# Patient Record
Sex: Female | Born: 1970 | Hispanic: No | Marital: Married | Smoking: Never smoker
Health system: Southern US, Community
[De-identification: ages and names within clinical notes are randomized; demographics above are authoritative.]

## PROBLEM LIST (undated history)

## (undated) DIAGNOSIS — M26629 Arthralgia of temporomandibular joint, unspecified side: Secondary | ICD-10-CM

## (undated) DIAGNOSIS — J309 Allergic rhinitis, unspecified: Secondary | ICD-10-CM

## (undated) DIAGNOSIS — E559 Vitamin D deficiency, unspecified: Secondary | ICD-10-CM

## (undated) HISTORY — PX: OTHER SURGICAL HISTORY: SHX169

## (undated) HISTORY — PX: BREAST BIOPSY: SHX20

---

## 2005-10-08 ENCOUNTER — Emergency Department: Payer: Self-pay

## 2005-10-09 ENCOUNTER — Ambulatory Visit: Payer: Self-pay

## 2006-04-28 ENCOUNTER — Encounter: Payer: Self-pay | Admitting: Maternal & Fetal Medicine

## 2006-05-08 ENCOUNTER — Inpatient Hospital Stay: Payer: Self-pay

## 2012-12-02 ENCOUNTER — Ambulatory Visit: Payer: Self-pay | Admitting: Nurse Practitioner

## 2013-12-28 ENCOUNTER — Ambulatory Visit: Payer: Self-pay | Admitting: Nurse Practitioner

## 2014-08-18 ENCOUNTER — Other Ambulatory Visit: Payer: Self-pay | Admitting: Nurse Practitioner

## 2014-08-18 DIAGNOSIS — N63 Unspecified lump in unspecified breast: Secondary | ICD-10-CM

## 2014-08-19 ENCOUNTER — Other Ambulatory Visit: Payer: Self-pay | Admitting: Nurse Practitioner

## 2014-08-19 ENCOUNTER — Ambulatory Visit
Admission: RE | Admit: 2014-08-19 | Discharge: 2014-08-19 | Disposition: A | Discharge status: Home / Self Care | Payer: 59 | Source: Ambulatory Visit | Attending: Nurse Practitioner | Admitting: Nurse Practitioner

## 2014-08-19 DIAGNOSIS — N63 Unspecified lump in unspecified breast: Secondary | ICD-10-CM

## 2014-08-19 DIAGNOSIS — N6320 Unspecified lump in the left breast, unspecified quadrant: Secondary | ICD-10-CM

## 2014-08-23 LAB — SURGICAL PATHOLOGY

## 2015-03-03 ENCOUNTER — Other Ambulatory Visit: Payer: Self-pay | Admitting: Family Medicine

## 2015-03-03 DIAGNOSIS — Z1231 Encounter for screening mammogram for malignant neoplasm of breast: Secondary | ICD-10-CM

## 2015-03-13 ENCOUNTER — Ambulatory Visit
Admission: RE | Admit: 2015-03-13 | Discharge: 2015-03-13 | Disposition: A | Discharge status: Home / Self Care | Payer: 59 | Source: Ambulatory Visit | Attending: Family Medicine | Admitting: Family Medicine

## 2015-03-13 DIAGNOSIS — Z1231 Encounter for screening mammogram for malignant neoplasm of breast: Secondary | ICD-10-CM | POA: Diagnosis present

## 2015-11-22 ENCOUNTER — Ambulatory Visit
Admission: EM | Admit: 2015-11-22 | Discharge: 2015-11-22 | Disposition: A | Discharge status: Home / Self Care | Payer: 59 | Attending: Family Medicine | Admitting: Family Medicine

## 2015-11-22 ENCOUNTER — Ambulatory Visit (INDEPENDENT_AMBULATORY_CARE_PROVIDER_SITE_OTHER): Payer: 59

## 2015-11-22 DIAGNOSIS — S91134A Puncture wound without foreign body of right lesser toe(s) without damage to nail, initial encounter: Secondary | ICD-10-CM

## 2015-11-22 MED ORDER — ACETAMINOPHEN 500 MG PO TABS: 1000.0000 mg | ORAL_TABLET | Freq: Four times a day (QID) | ORAL | 0 refills | Status: AC | PRN

## 2015-11-22 MED ORDER — SULFAMETHOXAZOLE-TRIMETHOPRIM 800-160 MG PO TABS: 1.0000 | ORAL_TABLET | Freq: Two times a day (BID) | ORAL | 0 refills | Status: DC

## 2015-11-22 NOTE — Discharge Instructions (Signed)
Hot water/epsom soaks 15 minutes at least daily x 1 week right foot

## 2015-11-22 NOTE — ED Triage Notes (Addendum)
Patient went to the beach about 3 weeks ago and stepped on a fish and it feels hot, itching, and swollen. Baby Toe right foot.

## 2015-11-22 NOTE — ED Provider Notes (Signed)
CSN: [id number]     Arrival date & time 11/22/15  0806 History   First MD Initiated Contact with Patient 11/22/15 [phone]     Chief Complaint  Patient presents with  . Toe Pain    baby toe on right foot   (Consider location/radiation/quality/duration/timing/severity/associated sxs/prior Treatment) Married [place] female here for evaluation right baby toe pain x 3 weeks.  [place] [employer] had sharp pain/bleeding from wound cleaned with water and antiseptic and has taken tylenol but still having pain and some redness at affected area.  Patient needs work note Education officer, environmentalnail salon technician      History reviewed. No pertinent past medical history. Past Surgical History:  Procedure Laterality Date  . BREAST BIOPSY Left    negative   Family History  Problem Relation Age of Onset  . Breast cancer Mother    Social History  Substance Use Topics  . Smoking status: Never Smoker  . Smokeless tobacco: Never Used  . Alcohol use No   OB History    No data available     Review of Systems  Constitutional: Negative for chills and fever.  HENT: Negative for ear pain and sore throat.   Eyes: Negative for pain and visual disturbance.  Respiratory: Negative for cough and shortness of breath.   Cardiovascular: Negative for chest pain and palpitations.  Gastrointestinal: Negative for abdominal pain and vomiting.  Endocrine: Negative for polydipsia, polyphagia and polyuria.  Genitourinary: Negative for dysuria and hematuria.  Musculoskeletal: Positive for myalgias. Negative for arthralgias, back pain, gait problem and joint swelling.  Skin: Positive for color change, rash and wound. Negative for pallor.  Allergic/Immunologic: Negative for environmental allergies and food allergies.  Neurological: Negative for seizures and syncope.  Hematological: Negative for adenopathy. Does not bruise/bleed easily.  Psychiatric/Behavioral: Negative for sleep disturbance.    All other systems reviewed and are negative.   Allergies  Review of patient's allergies indicates no known allergies.  Home Medications   Prior to Admission medications   Medication Sig Start Date End Date Taking? Authorizing Provider  acetaminophen (TYLENOL) 500 MG tablet Take 2 tablets (1,000 mg total) by mouth every 6 (six) hours as needed for mild pain or moderate pain. 11/22/15 11/29/15  Barbaraann Barthelina A Betancourt, NP  sulfamethoxazole-trimethoprim (BACTRIM DS,SEPTRA DS) 800-160 MG tablet Take 1 tablet by mouth 2 (two) times daily. 11/22/15   Barbaraann Barthelina A Betancourt, NP   Meds Ordered and Administered this Visit  Medications - No data to display  BP 121/72 (BP Location: Left Arm)   Pulse 74   Temp 98.1 F (36.7 C) (Oral)   Resp 18   Ht 5\' 4"  (1.626 m)   Wt 122 lb (55.3 kg)   LMP 11/19/2015 (Exact Date) Comment: denies preg  SpO2 100%   BMI 20.94 kg/m  No data found.   Physical Exam  Constitutional: She is oriented to person, place, and time. Vital signs are normal. She appears well-developed and well-nourished.  Non-toxic appearance. She does not have a sickly appearance. She does not appear ill. No distress.  HENT:  Head: Normocephalic and atraumatic.  Right Ear: Hearing and external ear normal.  Left Ear: Hearing and external ear normal.  Nose: Nose normal.  Mouth/Throat: Uvula is midline, oropharynx is clear and moist and mucous membranes are normal. No oropharyngeal exudate.  Eyes: Conjunctivae, EOM and lids are normal. Pupils are equal, round, and reactive to light. Right eye exhibits no discharge. Left eye exhibits  no discharge. Right conjunctiva is not injected. Right conjunctiva has no hemorrhage. Left conjunctiva is not injected. Left conjunctiva has no hemorrhage. No scleral icterus. Right eye exhibits normal extraocular motion and no nystagmus. Left eye exhibits normal extraocular motion and no nystagmus.  Neck: Trachea normal, normal range of motion and phonation normal. Neck  supple. No tracheal deviation present. No thyromegaly present.  Cardiovascular: Normal rate, regular rhythm and intact distal pulses.   No murmur heard. Pulses:      Dorsalis pedis pulses are 2+ on the right side, and 2+ on the left side.       Posterior tibial pulses are 2+ on the right side, and 2+ on the left side.  Pulmonary/Chest: Effort normal and breath sounds normal. No stridor. No respiratory distress.  Abdominal: Soft. Normal appearance. There is no tenderness.  Musculoskeletal: Normal range of motion. She exhibits tenderness. She exhibits no edema or deformity.       Right shoulder: Normal.       Left shoulder: Normal.       Right elbow: Normal.      Left elbow: Normal.       Right hip: Normal.       Left hip: Normal.       Right knee: Normal.       Left knee: Normal.       Right ankle: Normal.       Left ankle: Normal.       Right hand: Normal.       Left hand: Normal.       Right foot: There is tenderness and swelling. There is normal range of motion, no bony tenderness, normal capillary refill, no crepitus, no deformity and no laceration.       Left foot: Normal.       Feet:  Neurological: She is alert and oriented to person, place, and time. She has normal strength. She is not disoriented. She displays no atrophy, no tremor and normal reflexes. No sensory deficit. She exhibits normal muscle tone. She displays no seizure activity. Coordination and gait normal. GCS eye subscore is 4. GCS verbal subscore is 5. GCS motor subscore is 6.  Skin: Skin is warm and dry. Capillary refill takes less than 2 seconds. Abrasion and rash noted. No bruising, no burn, no ecchymosis, no laceration, no lesion and no petechiae noted. Rash is macular. No cyanosis or erythema. No pallor. Nails show no clubbing.  Psychiatric: She has a normal mood and affect. Her behavior is normal. Judgment and thought content normal. She is not actively hallucinating. Cognition and memory are normal. She is  attentive.  Nursing note and vitals reviewed.   Urgent Care Course   Clinical Course    Procedures (including critical care time)  Labs Review Labs Reviewed - No data to display  Imaging Review Dg Toe 5th Right  Result Date: 11/22/2015 CLINICAL DATA:  Pt stepped on fish or something she [place] beach aug 10th 2017 and has a small healing cut that has some redness around iy on her right 5th pinky toe on medial tip. Most pain is dip joint area. Pt states doesn't hurt much but is concerned about the redness. EXAM: RIGHT FIFTH TOE COMPARISON:  None. FINDINGS: No fracture. No bone lesion. There is developmental fusion of the DIP joint. There is soft tissue swelling, but no radiopaque foreign body. No bone resorption is seen to suggest osteomyelitis. Joints normally spaced and aligned. IMPRESSION: 1. No fracture, bone  lesion or evidence of osteomyelitis. 2. No radiopaque foreign body. Electronically Signed   By: Amie Portland M.D.   On: 11/22/2015 08:52     0840 patient to xray ambulatory with xray tech  0908 Discussed xray results negative for foreign body, fracture or osteomyelitis patient given copy of radiology report; discussed hot foot soaks with or without epsom salts daily x 1 week.  Bactrim DS po BID x 7 days tylenol 1000mg  po QID prn pain.  Follow up with provider if worsening or no improvement with plan of care.  Patient verbalized understanding information/instructions agreed with plan of care and had no further questions at this time.   MDM   1. Puncture wound of fifth toe of right foot, initial encounter    Exitcare handout on skin infection given to patient.  RTC if worsening erythema, pain, purulent discharge, fever.  Wash towels, washcloths, sheets in hot water with bleach every couple of days until infection resolved. Work excuse x 24 hours given to patient.  Bactrim DS po BID x 7 days tylenol 1000mg  po QID prn pain.  If swelling/pain with dependent position elevate  and apply ice. Patient verbalized understanding, agreed with plan of care and had no further questions at this time.      Barbaraann Barthel, NP 11/22/15 [phone]

## 2016-06-20 ENCOUNTER — Other Ambulatory Visit: Payer: Self-pay | Admitting: Family Medicine

## 2016-06-21 ENCOUNTER — Other Ambulatory Visit: Payer: Self-pay | Admitting: Family Medicine

## 2016-06-21 DIAGNOSIS — Z1239 Encounter for other screening for malignant neoplasm of breast: Secondary | ICD-10-CM

## 2016-07-03 ENCOUNTER — Encounter: Payer: Self-pay | Admitting: Radiology

## 2016-07-03 ENCOUNTER — Ambulatory Visit
Admission: RE | Admit: 2016-07-03 | Discharge: 2016-07-03 | Disposition: A | Discharge status: Home / Self Care | Payer: Medicaid Other | Source: Ambulatory Visit | Attending: Family Medicine | Admitting: Family Medicine

## 2016-07-03 DIAGNOSIS — Z1231 Encounter for screening mammogram for malignant neoplasm of breast: Secondary | ICD-10-CM | POA: Diagnosis present

## 2016-07-03 DIAGNOSIS — Z1239 Encounter for other screening for malignant neoplasm of breast: Secondary | ICD-10-CM

## 2016-07-11 ENCOUNTER — Other Ambulatory Visit: Payer: Self-pay | Admitting: Family Medicine

## 2016-07-11 DIAGNOSIS — N6489 Other specified disorders of breast: Secondary | ICD-10-CM

## 2016-07-11 DIAGNOSIS — R928 Other abnormal and inconclusive findings on diagnostic imaging of breast: Secondary | ICD-10-CM

## 2016-07-18 ENCOUNTER — Ambulatory Visit
Admission: RE | Admit: 2016-07-18 | Discharge: 2016-07-18 | Disposition: A | Discharge status: Home / Self Care | Payer: Medicaid Other | Source: Ambulatory Visit | Attending: Family Medicine | Admitting: Family Medicine

## 2016-07-18 DIAGNOSIS — N6489 Other specified disorders of breast: Secondary | ICD-10-CM | POA: Diagnosis present

## 2016-07-18 DIAGNOSIS — N6002 Solitary cyst of left breast: Secondary | ICD-10-CM | POA: Insufficient documentation

## 2016-07-18 DIAGNOSIS — R928 Other abnormal and inconclusive findings on diagnostic imaging of breast: Secondary | ICD-10-CM

## 2017-01-29 ENCOUNTER — Other Ambulatory Visit: Payer: Self-pay | Admitting: Family Medicine

## 2017-01-29 DIAGNOSIS — R928 Other abnormal and inconclusive findings on diagnostic imaging of breast: Secondary | ICD-10-CM

## 2017-03-04 ENCOUNTER — Ambulatory Visit
Admission: RE | Admit: 2017-03-04 | Discharge: 2017-03-04 | Disposition: A | Discharge status: Home / Self Care | Payer: Medicaid Other | Source: Ambulatory Visit | Attending: Family Medicine | Admitting: Family Medicine

## 2017-03-04 ENCOUNTER — Other Ambulatory Visit: Payer: Self-pay | Admitting: Family Medicine

## 2017-03-04 DIAGNOSIS — N6310 Unspecified lump in the right breast, unspecified quadrant: Secondary | ICD-10-CM

## 2017-03-04 DIAGNOSIS — R928 Other abnormal and inconclusive findings on diagnostic imaging of breast: Secondary | ICD-10-CM | POA: Diagnosis present

## 2017-03-04 DIAGNOSIS — N6322 Unspecified lump in the left breast, upper inner quadrant: Secondary | ICD-10-CM | POA: Insufficient documentation

## 2017-06-19 ENCOUNTER — Other Ambulatory Visit: Payer: Self-pay | Admitting: Family Medicine

## 2017-06-19 DIAGNOSIS — R928 Other abnormal and inconclusive findings on diagnostic imaging of breast: Secondary | ICD-10-CM

## 2017-07-11 ENCOUNTER — Ambulatory Visit
Admission: EM | Admit: 2017-07-11 | Discharge: 2017-07-11 | Disposition: A | Discharge status: Home / Self Care | Payer: BLUE CROSS/BLUE SHIELD | Attending: Family Medicine | Admitting: Family Medicine

## 2017-07-11 ENCOUNTER — Encounter: Payer: Self-pay | Admitting: *Deleted

## 2017-07-11 DIAGNOSIS — R350 Frequency of micturition: Secondary | ICD-10-CM | POA: Diagnosis not present

## 2017-07-11 DIAGNOSIS — R319 Hematuria, unspecified: Secondary | ICD-10-CM

## 2017-07-11 DIAGNOSIS — R300 Dysuria: Secondary | ICD-10-CM | POA: Diagnosis not present

## 2017-07-11 DIAGNOSIS — N3001 Acute cystitis with hematuria: Secondary | ICD-10-CM | POA: Diagnosis not present

## 2017-07-11 LAB — URINALYSIS, COMPLETE (UACMP) WITH MICROSCOPIC
Bacteria, UA: NONE SEEN
Bilirubin Urine: NEGATIVE
GLUCOSE, UA: NEGATIVE mg/dL
KETONES UR: NEGATIVE mg/dL
Nitrite: NEGATIVE
Protein, ur: 100 mg/dL — AB
Specific Gravity, Urine: 1.02 (ref 1.005–1.030)
pH: 7 (ref 5.0–8.0)

## 2017-07-11 MED ORDER — CEPHALEXIN 500 MG PO CAPS: 500.0000 mg | ORAL_CAPSULE | Freq: Two times a day (BID) | ORAL | 0 refills | Status: DC

## 2017-07-11 NOTE — ED Triage Notes (Signed)
C/o frequent urination with some blood in urine. Symptoms started this afternoon.

## 2017-07-11 NOTE — ED Provider Notes (Signed)
[facility]    CSN: [id number] Arrival date & time: 07/11/17  1656     History   Chief Complaint Chief Complaint  Patient presents with  . Urinary Tract Infection    HPI Heather Minh Aldrete is a 47 y.o. female.   The history is provided by the patient.  Urinary Tract Infection  Associated symptoms: no abdominal pain, no fever, no flank pain, no genital lesions, no nausea, no vaginal discharge and no vomiting   Dysuria  Pain quality:  Burning Pain severity:  Mild Onset quality:  Sudden Duration:  4 hours Timing:  Constant Progression:  Unchanged Chronicity:  New Recent urinary tract infections: no   Relieved by:  None tried Ineffective treatments:  None tried Urinary symptoms: frequent urination and hematuria   Associated symptoms: no abdominal pain, no fever, no flank pain, no genital lesions, no nausea, no vaginal discharge and no vomiting   Risk factors: no hx of pyelonephritis, no hx of urolithiasis, no kidney transplant, not pregnant, no recurrent urinary tract infections, no renal cysts, no renal disease, no single kidney and no urinary catheter     History reviewed. No pertinent past medical history.  There are no active problems to display for this patient.   Past Surgical History:  Procedure Laterality Date  . BREAST BIOPSY Left    negative    OB History   None      Home Medications    Prior to Admission medications   Medication Sig Start Date End Date Taking? Authorizing Provider  cephALEXin (KEFLEX) 500 MG capsule Take 1 capsule (500 mg total) by mouth 2 (two) times daily. 07/11/17   Payton Mccallumonty, Lataysha Vohra, MD  sulfamethoxazole-trimethoprim (BACTRIM DS,SEPTRA DS) 800-160 MG tablet Take 1 tablet by mouth 2 (two) times daily. 11/22/15   Betancourt, Jarold Songina A, NP    Family History Family History  Problem Relation Age of Onset  . Breast cancer Mother 236    Social History Social History   Tobacco Use  . Smoking status: Never Smoker  . Smokeless  tobacco: Never Used  Substance Use Topics  . Alcohol use: No  . Drug use: No     Allergies   Patient has no known allergies.   Review of Systems Review of Systems  Constitutional: Negative for fever.  Gastrointestinal: Negative for abdominal pain, nausea and vomiting.  Genitourinary: Positive for dysuria. Negative for flank pain and vaginal discharge.     Physical Exam Triage Vital Signs ED Triage Vitals  Enc Vitals Group     BP 07/11/17 1708 131/74     Pulse Rate 07/11/17 1708 70     Resp 07/11/17 1708 16     Temp 07/11/17 1708 98.2 F (36.8 C)     Temp Source 07/11/17 1708 Oral     SpO2 07/11/17 1708 100 %     Weight 07/11/17 1706 120 lb (54.4 kg)     Height 07/11/17 1706 5\' 4"  (1.626 m)     Head Circumference --      Peak Flow --      Pain Score 07/11/17 1705 0     Pain Loc --      Pain Edu? --      Excl. in [place]? --    No data found.  Updated Vital Signs BP 131/74 (BP Location: Left Arm)   Pulse 70   Temp 98.2 F (36.8 C) (Oral)   Resp 16   Ht 5\' 4"  (1.626 m)   Wt 120  lb (54.4 kg)   LMP 06/26/2017   SpO2 100%   BMI 20.60 kg/m   Visual Acuity Right Eye Distance:   Left Eye Distance:   Bilateral Distance:    Right Eye Near:   Left Eye Near:    Bilateral Near:     Physical Exam  Constitutional: She appears well-developed and well-nourished. No distress.  Abdominal: Soft. Bowel sounds are normal. She exhibits no distension and no mass. There is no tenderness. There is no rebound and no guarding.  Skin: She is not diaphoretic.  Nursing note and vitals reviewed.    UC Treatments / Results  Labs (all labs ordered are listed, but only abnormal results are displayed) Labs Reviewed  URINALYSIS, COMPLETE (UACMP) WITH MICROSCOPIC - Abnormal; Notable for the following components:      Result Value   Color, Urine PINK (*)    APPearance HAZY (*)    Hgb urine dipstick LARGE (*)    Protein, ur 100 (*)    Leukocytes, UA MODERATE (*)    Squamous  Epithelial / LPF 0-5 (*)    All other components within normal limits  URINE CULTURE    EKG None Radiology No results found.  Procedures Procedures (including critical care time)  Medications Ordered in UC Medications - No data to display   Initial Impression / Assessment and Plan / UC Course  I have reviewed the triage vital signs and the nursing notes.  Pertinent labs & imaging results that were available during my care of the patient were reviewed by me and considered in my medical decision making (see chart for details).       Final Clinical Impressions(s) / UC Diagnoses   Final diagnoses:  Acute cystitis with hematuria    ED Discharge Orders        Ordered    cephALEXin (KEFLEX) 500 MG capsule  2 times daily     07/11/17 1751     1. Lab results and diagnosis reviewed with patient 2. rx as per orders above; reviewed possible side effects, interactions, risks and benefits  3. Recommend supportive treatment with increased water intake  4. Follow-up prn if symptoms worsen or don't improve  Controlled Substance Prescriptions [place] Controlled Substance Registry consulted? Not Applicable   Payton Mccallum, MD 07/11/17 1755

## 2017-07-14 LAB — URINE CULTURE: SPECIAL REQUESTS: NORMAL

## 2017-07-16 ENCOUNTER — Telehealth (HOSPITAL_COMMUNITY): Payer: Self-pay

## 2017-07-16 NOTE — Telephone Encounter (Signed)
Pt urine culture positive for E.Coli, this was treated with Keflex at urgent care visit. Patient called and educated to finish all of antibiotics and follow up for persistent symptoms.

## 2017-10-10 ENCOUNTER — Other Ambulatory Visit: Payer: BLUE CROSS/BLUE SHIELD

## 2017-10-20 ENCOUNTER — Ambulatory Visit
Admission: RE | Admit: 2017-10-20 | Discharge: 2017-10-20 | Disposition: A | Discharge status: Home / Self Care | Payer: BLUE CROSS/BLUE SHIELD | Source: Ambulatory Visit | Attending: Family Medicine | Admitting: Family Medicine

## 2017-10-20 DIAGNOSIS — R928 Other abnormal and inconclusive findings on diagnostic imaging of breast: Secondary | ICD-10-CM

## 2018-10-01 ENCOUNTER — Other Ambulatory Visit: Payer: Self-pay

## 2018-10-01 ENCOUNTER — Encounter: Payer: Self-pay | Admitting: Emergency Medicine

## 2018-10-01 ENCOUNTER — Ambulatory Visit
Admission: EM | Admit: 2018-10-01 | Discharge: 2018-10-01 | Disposition: A | Discharge status: Home / Self Care | Payer: BC Managed Care – PPO | Attending: Emergency Medicine | Admitting: Emergency Medicine

## 2018-10-01 DIAGNOSIS — R300 Dysuria: Secondary | ICD-10-CM | POA: Diagnosis not present

## 2018-10-01 DIAGNOSIS — R319 Hematuria, unspecified: Secondary | ICD-10-CM

## 2018-10-01 DIAGNOSIS — N390 Urinary tract infection, site not specified: Secondary | ICD-10-CM

## 2018-10-01 LAB — URINALYSIS, COMPLETE (UACMP) WITH MICROSCOPIC
RBC / HPF: >50 RBC/hpf (ref 0–5)
WBC, UA: >50 WBC/hpf (ref 0–5)

## 2018-10-01 MED ORDER — CEPHALEXIN 500 MG PO CAPS: 500.0000 mg | ORAL_CAPSULE | Freq: Two times a day (BID) | ORAL | 0 refills | Status: AC

## 2018-10-01 NOTE — ED Triage Notes (Signed)
Patient states she has had frequent urination for the past several days and states she feels like it could be a UTI

## 2018-10-01 NOTE — Discharge Instructions (Addendum)
Take medication as prescribed. Rest. Drink plenty of fluids.  ° °Follow up with your primary care physician this week as needed. Return to Urgent care for new or worsening concerns.  ° °

## 2018-10-01 NOTE — ED Provider Notes (Addendum)
[facility] ____________________________________________  Time seen: Approximately 4:22 PM  I have reviewed the triage vital signs and the nursing notes.   HISTORY  Chief Complaint Dysuria   HPI Heather Harrison is a 48 y.o. female presenting for evaluation of urinary frequency, urinary urgency and some burning with urination present for the last several days.  Patient states this feels like her previous UTI.  Has not taken any over-the-counter medication for the same complaints.  Denies vaginal complaints.  Denies pain at this time.  Continues eat and drink well.  Denies chest pain, shortness of breath, abdominal pain, back pain, recent antibiotic use or fevers.   History reviewed. No pertinent past medical history.  There are no active problems to display for this patient.   Past Surgical History:  Procedure Laterality Date  . BREAST BIOPSY Left    negative  . c-section       No current facility-administered medications for this encounter.   Current Outpatient Medications:  .  cholecalciferol (VITAMIN D3) 25 MCG (1000 UT) tablet, Take 1,000 Units by mouth daily., Disp: , Rfl:  .  cephALEXin (KEFLEX) 500 MG capsule, Take 1 capsule (500 mg total) by mouth 2 (two) times daily for 7 days., Disp: 14 capsule, Rfl: 0  Allergies Patient has no known allergies.  Family History  Problem Relation Age of Onset  . Breast cancer Mother 70    Social History Social History   Tobacco Use  . Smoking status: Never Smoker  . Smokeless tobacco: Never Used  Substance Use Topics  . Alcohol use: No  . Drug use: No    Review of Systems Constitutional: No fever ENT: No sore throat. Cardiovascular: Denies chest pain. Respiratory: Denies shortness of breath. Gastrointestinal: No abdominal pain.   Genitourinary: Positive for dysuria. Musculoskeletal: Negative for back pain. Skin: Negative for rash.   ____________________________________________   PHYSICAL EXAM:   VITAL SIGNS: ED Triage Vitals  Enc Vitals Group     BP 10/01/18 1532 122/83     Pulse Rate 10/01/18 1532 86     Resp 10/01/18 1532 18     Temp 10/01/18 1532 98.8 F (37.1 C)     Temp Source 10/01/18 1532 Oral     SpO2 10/01/18 1532 100 %     Weight 10/01/18 1535 128 lb (58.1 kg)     Height 10/01/18 1535 5\' 4"  (1.626 m)     Head Circumference --      Peak Flow --      Pain Score 10/01/18 1535 0     Pain Loc --      Pain Edu? --      Excl. in [place]? --     Constitutional: Alert and oriented. Well appearing and in no acute distress. Eyes: Conjunctivae are normal. ENT      Head: Normocephalic and atraumatic. Cardiovascular: Normal rate, regular rhythm. Grossly normal heart sounds.  Good peripheral circulation. Respiratory: Normal respiratory effort without tachypnea nor retractions. Breath sounds are clear and equal bilaterally. No wheezes, rales, rhonchi. Gastrointestinal: Soft and nontender. No distention. No CVA tenderness. Musculoskeletal: No midline cervical, thoracic or lumbar tenderness to palpation.  Neurologic:  Normal speech and language.Speech is normal. No gait instability.  Skin:  Skin is warm, dry. Psychiatric: Mood and affect are normal. Speech and behavior are normal. Patient exhibits appropriate insight and judgment   ___________________________________________   LABS (all labs ordered are listed, but only abnormal results are displayed)  Labs Reviewed  URINALYSIS, COMPLETE (  UACMP) WITH MICROSCOPIC - Abnormal; Notable for the following components:      Result Value   Color, Urine RED (*)    APPearance CLOUDY (*)    Glucose, UA   (*)    Value: TEST NOT REPORTED DUE TO COLOR INTERFERENCE OF URINE PIGMENT   Hgb urine dipstick   (*)    Value: TEST NOT REPORTED DUE TO COLOR INTERFERENCE OF URINE PIGMENT   Bilirubin Urine   (*)    Value: TEST NOT REPORTED DUE TO COLOR INTERFERENCE OF URINE PIGMENT   Ketones, ur   (*)    Value: TEST NOT REPORTED DUE TO COLOR  INTERFERENCE OF URINE PIGMENT   Protein, ur   (*)    Value: TEST NOT REPORTED DUE TO COLOR INTERFERENCE OF URINE PIGMENT   Nitrite   (*)    Value: TEST NOT REPORTED DUE TO COLOR INTERFERENCE OF URINE PIGMENT   Leukocytes,Ua   (*)    Value: TEST NOT REPORTED DUE TO COLOR INTERFERENCE OF URINE PIGMENT   Bacteria, UA RARE (*)    All other components within normal limits  URINE CULTURE     PROCEDURES Procedures    INITIAL IMPRESSION / ASSESSMENT AND PLAN / ED COURSE  Pertinent labs & imaging results that were available during my care of the patient were reviewed by me and considered in my medical decision making (see chart for details).  Well-appearing patient.  No acute distress.  Urinalysis reviewed, suspect UTI.  We will culture urine.  Will empirically start oral Keflex.  Encourage rest, fluids, supportive care.Discussed indication, risks and benefits of medications with patient.  Discussed follow up with Primary care physician this week. Discussed follow up and return parameters including no resolution or any worsening concerns. Patient verbalized understanding and agreed to plan.   ____________________________________________   FINAL CLINICAL IMPRESSION(S) / ED DIAGNOSES  Final diagnoses:  Dysuria  Urinary tract infection with hematuria, site unspecified     ED Discharge Orders         Ordered    cephALEXin (KEFLEX) 500 MG capsule  2 times daily     10/01/18 1623           Note: This dictation was prepared with Dragon dictation along with smaller phrase technology. Any transcriptional errors that result from this process are unintentional.         Renford DillsMiller, Chandler Swiderski, NP 10/01/18 1632    Renford DillsMiller, Deni Berti, NP 10/01/18 [phone]

## 2018-10-03 LAB — URINE CULTURE: Culture: NO GROWTH

## 2019-06-07 ENCOUNTER — Ambulatory Visit
Admission: EM | Admit: 2019-06-07 | Discharge: 2019-06-07 | Disposition: A | Discharge status: Home / Self Care | Payer: BC Managed Care – PPO | Attending: Family Medicine | Admitting: Family Medicine

## 2019-06-07 ENCOUNTER — Other Ambulatory Visit: Payer: Self-pay

## 2019-06-07 ENCOUNTER — Encounter: Payer: Self-pay | Admitting: Emergency Medicine

## 2019-06-07 DIAGNOSIS — M62838 Other muscle spasm: Secondary | ICD-10-CM

## 2019-06-07 NOTE — Discharge Instructions (Signed)
Use heat or ice over the area for help with the spasm.  Consider using Biofreeze over the area for comfort.  May use Tylenol for pain being careful not to take more than 4000 mg daily.

## 2019-06-07 NOTE — ED Provider Notes (Signed)
[facility]    CSN: [id number] Arrival date & time: 06/07/19  1031      History   Chief Complaint Chief Complaint  Patient presents with  . Neck Pain    HPI Heather Harrison is a 49 y.o. female.   HPI  -year-old female presents with pain on the left side of her neck from the occipital ridge inferiorly into the left trapezial area.  She has no known injury.  She received her Covid vaccine shot on Saturday 2 days prior to this visit.  She states that the evening of receiving the injection , she started noticing her neck pain.  Is that worsened since that period of time.  She has no radicular symptoms.  Does have an area that seem to be maximal in tenderness and a lump type feeling in this area.  She has no fever or chills.      History reviewed. No pertinent past medical history.  There are no problems to display for this patient.   Past Surgical History:  Procedure Laterality Date  . BREAST BIOPSY Left    negative  . c-section      OB History   No obstetric history on file.      Home Medications    Prior to Admission medications   Medication Sig Start Date End Date Taking? Authorizing Provider  cholecalciferol (VITAMIN D3) 25 MCG (1000 UT) tablet Take 1,000 Units by mouth daily.   Yes [provider]  omeprazole (PRILOSEC OTC) 20 MG tablet Take by mouth.    [provider]    Family History Family History  Problem Relation Age of Onset  . Breast cancer Mother 61    Social History Social History   Tobacco Use  . Smoking status: Never Smoker  . Smokeless tobacco: Never Used  Substance Use Topics  . Alcohol use: No  . Drug use: No     Allergies   Patient has no known allergies.   Review of Systems Review of Systems  Constitutional: Positive for activity change. Negative for appetite change, chills, diaphoresis, fatigue and fever.  Musculoskeletal: Positive for myalgias and neck pain.  All other systems reviewed and  are negative.    Physical Exam Triage Vital Signs ED Triage Vitals  Enc Vitals Group     BP 06/07/19 1102 140/87     Pulse Rate 06/07/19 1102 76     Resp 06/07/19 1102 18     Temp 06/07/19 1102 98.3 F (36.8 C)     Temp Source 06/07/19 1102 Oral     SpO2 06/07/19 1102 100 %     Weight 06/07/19 1058 126 lb (57.2 kg)     Height 06/07/19 1058 5\' 4"  (1.626 m)     Head Circumference --      Peak Flow --      Pain Score 06/07/19 1058 6     Pain Loc --      Pain Edu? --      Excl. in GC? --    No data found.  Updated Vital Signs BP 140/87 (BP Location: Left Arm)   Pulse 76   Temp 98.3 F (36.8 C) (Oral)   Resp 18   Ht 5\' 4"  (1.626 m)   Wt 126 lb (57.2 kg)   LMP 05/14/2019 (Approximate)   SpO2 100%   BMI 21.63 kg/m   Visual Acuity Right Eye Distance:   Left Eye Distance:   Bilateral Distance:    Right  Eye Near:   Left Eye Near:    Bilateral Near:     Physical Exam Vitals and nursing note reviewed.  Constitutional:      General: She is not in acute distress.    Appearance: Normal appearance. She is normal weight. She is not ill-appearing or toxic-appearing.  HENT:     Head: Normocephalic and atraumatic.  Eyes:     Conjunctiva/sclera: Conjunctivae normal.  Neck:     Comments: Examination of the cervical spine shows tenderness to palpation along the paraspinous muscles on the left.  Is 1 small area tenderness with with palpable spasm inferior to the occipital ridge.  Lateral flexion and extension to the left causes her to have discomfort in the area.  Also flexion at the extreme also causes her to have the pain.  Upper extremity strength and sensation are intact to clinical resistance. Musculoskeletal:        General: Tenderness present. Normal range of motion.     Cervical back: Normal range of motion. Tenderness present. No rigidity.  Skin:    General: Skin is warm and dry.  Neurological:     General: No focal deficit present.     Mental Status: She is alert  and oriented to person, place, and time.  Psychiatric:        Mood and Affect: Mood normal.        Behavior: Behavior normal.        Thought Content: Thought content normal.        Judgment: Judgment normal.      UC Treatments / Results  Labs (all labs ordered are listed, but only abnormal results are displayed) Labs Reviewed - No data to display  EKG   Radiology No results found.  Procedures Procedures (including critical care time)  Medications Ordered in UC Medications - No data to display  Initial Impression / Assessment and Plan / UC Course  I have reviewed the triage vital signs and the nursing notes.  Pertinent labs & imaging results that were available during my care of the patient were reviewed by me and considered in my medical decision making (see chart for details).   49 year old female presents with sided neck pain of 3 days duration.  Is no known injury.  She states that the pain is in the paraspinous muscles just inferior to the occipital ridge.  She has no radicular symptoms.  She states that she does have area that is point tender.  Is concerned that this may be a result of her Covid in injection for vaccine.  Examination showed mild limitation of motion of the cervical spine particularly with flexion ,lateral flexion and extension.  Also she is tender in the paraspinous muscles from the occipital ridge into the trapezius.  This reproduces her symptoms.  Since she has recently undergone injection of Covid vaccine in the left deltoid. I have recommended that she take Tylenol for pain and consider using Biofreeze or similar topical treatment for pain control.  She should avoid symptoms much as possible.  She may also use heat or ice as necessary.  If she is not improving she should follow-up with her primary care physician.   Final Clinical Impressions(s) / UC Diagnoses   Final diagnoses:  Muscle spasms of neck     Discharge Instructions     Use heat or  ice over the area for help with the spasm.  Consider using Biofreeze over the area for comfort.  May use Tylenol for pain  being careful not to take more than 4000 mg daily.   ED Prescriptions    None     PDMP not reviewed this encounter.   Lorin Picket, PA-C 06/07/19 1713

## 2019-06-07 NOTE — ED Triage Notes (Signed)
Pt c/o neck pain on her left side. Started about 3 days ago. No known injury. She is concerned it is related to the covid vaccine because she received it the same day it started hurting.

## 2019-09-29 ENCOUNTER — Other Ambulatory Visit: Payer: Self-pay | Admitting: Gerontology

## 2019-09-29 DIAGNOSIS — R599 Enlarged lymph nodes, unspecified: Secondary | ICD-10-CM

## 2019-12-15 ENCOUNTER — Ambulatory Visit
Admission: EM | Admit: 2019-12-15 | Discharge: 2019-12-15 | Disposition: A | Discharge status: Home / Self Care | Payer: BC Managed Care – PPO | Attending: Emergency Medicine | Admitting: Emergency Medicine

## 2019-12-15 ENCOUNTER — Other Ambulatory Visit: Payer: Self-pay

## 2019-12-15 ENCOUNTER — Encounter: Payer: Self-pay | Admitting: Emergency Medicine

## 2019-12-15 DIAGNOSIS — R5383 Other fatigue: Secondary | ICD-10-CM | POA: Insufficient documentation

## 2019-12-15 DIAGNOSIS — S00512A Abrasion of oral cavity, initial encounter: Secondary | ICD-10-CM | POA: Diagnosis present

## 2019-12-15 LAB — GLUCOSE, CAPILLARY: Glucose-Capillary: 75 mg/dL (ref 70–99)

## 2019-12-15 MED ORDER — CHLORHEXIDINE GLUCONATE 0.12 % MT SOLN: OROMUCOSAL | 0 refills | Status: AC

## 2019-12-15 NOTE — Discharge Instructions (Addendum)
Salt water rinses 3-4 times a day to help keep your mouth clean.  May use the Peridex or Listerine as well, which have antibiotic properties.  Continue the triamcinolone paste.  This will take 4 to 7 days for it to fully heal.  I do not think that you need oral antibiotics at this time.  Your sugar is normal.  This is not low blood sugar or diabetes.  Get an extra hour of sleep at night and see if that does not help with your fatigue.  Follow-up with your doctor in several days if it does not resolve. There are many different causes of being tired, however I do not think that you have an emergency today.

## 2019-12-15 NOTE — ED Provider Notes (Signed)
HPI  SUBJECTIVE:  Heather Harrison is a 49 y.o. female who presents with 2 issues.  First, she reports an abrasion to the roof of her mouth after eating some hard bread the other day.  She states that she scraped her mouth.  Denies pain with swallowing.  She saw a dentist yesterday and was started on triamcinolone paste which she states has made things significantly better however she would like to make sure that she is on the right medication and wants to make sure that she does not have an infection that would require antibiotics.  Second, she reports mild fatigue described as "being tired" for the past 2 days.  She denies fevers, body, headache, chest pain, shortness of breath, nasal congestion, cough, wheeze, dyspnea on exertion, abdominal pain.  No urinary complaints.  No vomiting, diarrhea.  No loss of smell or taste.  No known exposure to Covid.  She got both doses of the Covid vaccine, last dose in April.  No vaginal bleeding, melena, hematochezia.  No palpitations, dizziness, lightheadedness, syncope.  States that she is sleeping 6 hours a night which is normal for her.  No change in physical activity.  She states that she feels as if she is always cold, but this is nothing new or different.  No unintentional weight gain, skin changes or hair loss.  She is concerned that she may have cancer.  She has a past medical history of GERD and vitamin D deficiency.  No history of MI, hypertension, diabetes, hypothyroidism, smoking, dipping, other tobacco use,  anemia, hypercholesterolemia, arrhythmia.  WUJ:[id number], Saintclair Halsted, DO  History reviewed. No pertinent past medical history.  Past Surgical History:  Procedure Laterality Date  . BREAST BIOPSY Left    negative  . c-section      Family History  Problem Relation Age of Onset  . Breast cancer Mother 52    Social History   Tobacco Use  . Smoking status: Never Smoker  . Smokeless tobacco: Never Used  Vaping Use  . Vaping Use: Never  used  Substance Use Topics  . Alcohol use: No  . Drug use: No    No current facility-administered medications for this encounter.  Current Outpatient Medications:  .  cholecalciferol (VITAMIN D3) 25 MCG (1000 UT) tablet, Take 1,000 Units by mouth daily., Disp: , Rfl:  .  omeprazole (PRILOSEC OTC) 20 MG tablet, Take by mouth., Disp: , Rfl:  .  chlorhexidine (PERIDEX) 0.12 % solution, 15 mL swish and spit bid, Disp: 480 mL, Rfl: 0  No Known Allergies   ROS  As noted in HPI.   Physical Exam  BP (!) 135/93 (BP Location: Left Arm)   Pulse 73   Temp 98.2 F (36.8 C)   Resp 18   Ht 5\' 4"  (1.626 m)   Wt 57.2 kg   LMP 11/17/2019 (Approximate)   SpO2 99%   BMI 21.65 kg/m   Constitutional: Well developed, well nourished, no acute distress Eyes: PERRL, EOMI, conjunctiva normal bilaterally HENT: Normocephalic, atraumatic,mucus membranes moist.  Large tender superficial abrasion right side roof of the mouth.  No other oral lesions, swelling underneath the tongue.  Normal dentition.  No gingival swelling. Neck: No cervical lymphadenopathy, submental lymphadenopathy.  No thyromegaly. Respiratory: Clear to auscultation bilaterally, no rales, no wheezing, no rhonchi Cardiovascular: Normal rate and rhythm, no murmurs, no gallops, no rubs GI: Soft, nondistended, normal bowel sounds, nontender, no rebound, no guarding.  No apparent splenomegaly skin: No rash, skin intact Musculoskeletal:  No edema, no tenderness, no deformities Neurologic: Alert & oriented x 3, CN III-XII grossly intact, no motor deficits, sensation grossly intact. brachioradialis, patellar reflexes 2+ and equal bilaterally. Psychiatric: Speech and behavior appropriate   ED Course   Medications - No data to display  Orders Placed This Encounter  Procedures  . Glucose, capillary    Standing Status:   Standing    Number of Occurrences:   1  . CBG monitoring, ED    Standing Status:   Standing    Number of  Occurrences:   1   Results for orders placed or performed during the hospital encounter of 12/15/19 (from the past 24 hour(s))  Glucose, capillary     Status: None   Collection Time: 12/15/19  5:52 PM  Result Value Ref Range   Glucose-Capillary 75 70 - 99 mg/dL   Comment 1 Document in Chart    No results found.  ED Clinical Impression  1. Abrasion of oral cavity, initial encounter   2. Fatigue, unspecified type      ED Assessment/Plan   1.  Abrasion on the roof of the mouth- I do not think that she needs oral antibiotics.  Salt water rinses, Peridex or Listerine, continue triamcinolone paste.  2.  Fatigue.  Patient has no other complaints other than mild fatigue described as "being tired".  We will check a point-of-care glucose, however discussed with patient that there are many reasons for fatigue.  Do not think it is an electrolyte imbalance, acute renal failure, acute anemia, dehydration, thyroid storm, acute hypothyroidism.  She has no respiratory or urinary complaints.  Her sugar is normal.  Patient has no risk factors for malignancy, although malignancy can cause fatigue.  She has no other signs or symptoms that would point me in that direction at this time.  Advised trying to get some extra rest over the next day or 2 and if persists, follow-up with her PMD.  I do not think that there is an emergent cause of her fatigue.  Glucose 75.  Discussed labs, MDM, treatment plan, and plan for follow-up with patient. patient agrees with plan.   Meds ordered this encounter  Medications  . chlorhexidine (PERIDEX) 0.12 % solution    Sig: 15 mL swish and spit bid    Dispense:  480 mL    Refill:  0    *This clinic note was created using Scientist, clinical (histocompatibility and immunogenetics). Therefore, there may be occasional mistakes despite careful proofreading.  ?    Domenick Gong, MD 12/15/19 2023

## 2019-12-15 NOTE — ED Triage Notes (Signed)
Pt states she ate food yesterday and burnt her mouth and she has had reflux since eating the food also and she feels very tired.

## 2020-05-16 ENCOUNTER — Ambulatory Visit
Admission: EM | Admit: 2020-05-16 | Discharge: 2020-05-16 | Disposition: A | Discharge status: Home / Self Care | Payer: BC Managed Care – PPO | Attending: Emergency Medicine | Admitting: Emergency Medicine

## 2020-05-16 ENCOUNTER — Other Ambulatory Visit: Payer: Self-pay

## 2020-05-16 DIAGNOSIS — N390 Urinary tract infection, site not specified: Secondary | ICD-10-CM | POA: Insufficient documentation

## 2020-05-16 LAB — URINALYSIS, COMPLETE (UACMP) WITH MICROSCOPIC
Bilirubin Urine: NEGATIVE
Glucose, UA: NEGATIVE mg/dL
Ketones, ur: NEGATIVE mg/dL
Nitrite: NEGATIVE
Protein, ur: NEGATIVE mg/dL
Specific Gravity, Urine: 1.015 (ref 1.005–1.030)
pH: 6.5 (ref 5.0–8.0)

## 2020-05-16 MED ORDER — PHENAZOPYRIDINE HCL 200 MG PO TABS: 200.0000 mg | ORAL_TABLET | Freq: Three times a day (TID) | ORAL | 0 refills | Status: AC

## 2020-05-16 MED ORDER — FLUCONAZOLE 200 MG PO TABS: 200.0000 mg | ORAL_TABLET | Freq: Every day | ORAL | 1 refills | Status: AC

## 2020-05-16 MED ORDER — NITROFURANTOIN MONOHYD MACRO 100 MG PO CAPS: 100.0000 mg | ORAL_CAPSULE | Freq: Two times a day (BID) | ORAL | 0 refills | Status: AC

## 2020-05-16 NOTE — ED Triage Notes (Signed)
Pt reports having dysuria, burring with urination and some hematuria x2 days.

## 2020-05-16 NOTE — Discharge Instructions (Addendum)
Take the Macrobid twice daily for 5 days for treatment of urinary tract infection.  Take it with food.  Use the Pyridium every 8 hours as needed for urinary discomfort.  This will turn your urine bright red-orange so do not be alarmed.  Your urine also showed the presence of yeast, which may be causing your vaginal itching.  Take the Diflucan for the yeast infection.  Take 1 tablet now and repeat in 1 week if you are still having symptoms.  We will culture your urine and make adjustments to the antibiotics if necessary.  Return for reevaluation, or see your primary care provider, for any new or worsening symptoms.

## 2020-05-16 NOTE — ED Provider Notes (Signed)
[facility]    CSN: [id number] Arrival date & time: 05/16/20  1622      History   Chief Complaint Chief Complaint  Patient presents with  . Dysuria    HPI Heather Harrison is a 50 y.o. female.   HPI   50 year old female here for evaluation of burning with urination and blood in her urine.  Patient reports that her symptoms started 2 days ago.  She reports that she had been experiencing some vaginal itching that she thought was due to dryness, spoke to her sister in [place] who was told to apply Vaseline so that is what she did.  Patient then developed the burning with urination, urinary urgency and frequency, and has seen blood in her urine as well.  Patient denies fever, nausea, or vomiting.  Patient does endorse some low back pain.  History reviewed. No pertinent past medical history.  There are no problems to display for this patient.   Past Surgical History:  Procedure Laterality Date  . BREAST BIOPSY Left    negative  . c-section      OB History   No obstetric history on file.      Home Medications    Prior to Admission medications   Medication Sig Start Date End Date Taking? Authorizing Provider  fluconazole (DIFLUCAN) 200 MG tablet Take 1 tablet (200 mg total) by mouth daily for 2 doses. 05/16/20 05/18/20 Yes Becky Augusta, NP  nitrofurantoin, macrocrystal-monohydrate, (MACROBID) 100 MG capsule Take 1 capsule (100 mg total) by mouth 2 (two) times daily. 05/16/20  Yes Becky Augusta, NP  phenazopyridine (PYRIDIUM) 200 MG tablet Take 1 tablet (200 mg total) by mouth 3 (three) times daily. 05/16/20  Yes Becky Augusta, NP  chlorhexidine (PERIDEX) 0.12 % solution 15 mL swish and spit bid 12/15/19   Domenick Gong, MD  cholecalciferol (VITAMIN D3) 25 MCG (1000 UT) tablet Take 1,000 Units by mouth daily.    [provider]  omeprazole (PRILOSEC OTC) 20 MG tablet Take by mouth.    [provider]    Family History Family History  Problem  Relation Age of Onset  . Breast cancer Mother 65    Social History Social History   Tobacco Use  . Smoking status: Never Smoker  . Smokeless tobacco: Never Used  Vaping Use  . Vaping Use: Never used  Substance Use Topics  . Alcohol use: No  . Drug use: No     Allergies   Patient has no known allergies.   Review of Systems Review of Systems  Constitutional: Negative for activity change, appetite change and fever.  Gastrointestinal: Negative for abdominal pain, nausea and vomiting.  Genitourinary: Positive for dysuria, frequency, hematuria and urgency. Negative for vaginal discharge.  Musculoskeletal: Positive for back pain.     Physical Exam Triage Vital Signs ED Triage Vitals  Enc Vitals Group     BP 05/16/20 1639 120/86     Pulse Rate 05/16/20 1639 80     Resp 05/16/20 1639 16     Temp 05/16/20 1639 98.3 F (36.8 C)     Temp Source 05/16/20 1639 Oral     SpO2 05/16/20 1639 100 %     Weight 05/16/20 1640 120 lb (54.4 kg)     Height 05/16/20 1640 5\' 4"  (1.626 m)     Head Circumference --      Peak Flow --      Pain Score 05/16/20 1640 5     Pain Loc --  Pain Edu? --      Excl. in GC? --    No data found.  Updated Vital Signs BP 120/86   Pulse 80   Temp 98.3 F (36.8 C) (Oral)   Resp 16   Ht 5\' 4"  (1.626 m)   Wt 120 lb (54.4 kg)   SpO2 100%   BMI 20.60 kg/m   Visual Acuity Right Eye Distance:   Left Eye Distance:   Bilateral Distance:    Right Eye Near:   Left Eye Near:    Bilateral Near:     Physical Exam Vitals and nursing note reviewed.  Constitutional:      General: She is not in acute distress.    Appearance: Normal appearance. She is not ill-appearing.  HENT:     Head: Normocephalic and atraumatic.  Cardiovascular:     Rate and Rhythm: Normal rate and regular rhythm.     Pulses: Normal pulses.     Heart sounds: Normal heart sounds. No murmur heard.   Pulmonary:     Effort: Pulmonary effort is normal.     Breath sounds:  Normal breath sounds. No wheezing, rhonchi or rales.  Abdominal:     Tenderness: There is no right CVA tenderness or left CVA tenderness.  Skin:    General: Skin is warm and dry.     Capillary Refill: Capillary refill takes less than 2 seconds.     Findings: No erythema or rash.  Neurological:     General: No focal deficit present.     Mental Status: She is alert and oriented to person, place, and time.  Psychiatric:        Mood and Affect: Mood normal.        Behavior: Behavior normal.        Thought Content: Thought content normal.        Judgment: Judgment normal.      UC Treatments / Results  Labs (all labs ordered are listed, but only abnormal results are displayed) Labs Reviewed  URINALYSIS, COMPLETE (UACMP) WITH MICROSCOPIC - Abnormal; Notable for the following components:      Result Value   APPearance CLOUDY (*)    Hgb urine dipstick LARGE (*)    Leukocytes,Ua MODERATE (*)    Bacteria, UA FEW (*)    All other components within normal limits  URINE CULTURE    EKG   Radiology No results found.  Procedures Procedures (including critical care time)  Medications Ordered in UC Medications - No data to display  Initial Impression / Assessment and Plan / UC Course  I have reviewed the triage vital signs and the nursing notes.  Pertinent labs & imaging results that were available during my care of the patient were reviewed by me and considered in my medical decision making (see chart for details).   Patient is a very pleasant 50 year old female here for evaluation of urinary symptoms that have been going on for last 2 days.  Patient is nontoxic in appearance.  She states that she had had some vaginal itching that she thought was due to dryness and use some Vaseline to help moisturize her mucosa and then her UTI symptoms developed.  Urinalysis shows cloudy appearance, large hemoglobin, moderate leukocytes, 11-20 WBCs, 21-50 RBCs, few bacteria, and budding yeast.  No  nitrites or glucose present.  Will send urine for culture.  Will treat patient for UTI with Macrobid twice daily for 5 days, Pyridium for urinary discomfort, and Diflucan for yeast.  Final Clinical Impressions(s) / UC Diagnoses   Final diagnoses:  Lower urinary tract infectious disease     Discharge Instructions     Take the Macrobid twice daily for 5 days for treatment of urinary tract infection.  Take it with food.  Use the Pyridium every 8 hours as needed for urinary discomfort.  This will turn your urine bright red-orange so do not be alarmed.  Your urine also showed the presence of yeast, which may be causing your vaginal itching.  Take the Diflucan for the yeast infection.  Take 1 tablet now and repeat in 1 week if you are still having symptoms.  We will culture your urine and make adjustments to the antibiotics if necessary.  Return for reevaluation, or see your primary care provider, for any new or worsening symptoms.    ED Prescriptions    Medication Sig Dispense Auth. Provider   nitrofurantoin, macrocrystal-monohydrate, (MACROBID) 100 MG capsule Take 1 capsule (100 mg total) by mouth 2 (two) times daily. 10 capsule Becky Augusta, NP   phenazopyridine (PYRIDIUM) 200 MG tablet Take 1 tablet (200 mg total) by mouth 3 (three) times daily. 6 tablet Becky Augusta, NP   fluconazole (DIFLUCAN) 200 MG tablet Take 1 tablet (200 mg total) by mouth daily for 2 doses. 2 tablet Becky Augusta, NP     PDMP not reviewed this encounter.   Becky Augusta, NP 05/16/20 [phone]

## 2020-05-18 LAB — URINE CULTURE
Culture: NO GROWTH
Special Requests: NORMAL

## 2020-12-01 ENCOUNTER — Other Ambulatory Visit: Payer: Self-pay | Admitting: Family Medicine

## 2020-12-01 DIAGNOSIS — R928 Other abnormal and inconclusive findings on diagnostic imaging of breast: Secondary | ICD-10-CM

## 2020-12-01 DIAGNOSIS — N6002 Solitary cyst of left breast: Secondary | ICD-10-CM

## 2020-12-08 ENCOUNTER — Other Ambulatory Visit: Payer: BC Managed Care – PPO

## 2020-12-18 ENCOUNTER — Other Ambulatory Visit: Payer: Self-pay

## 2020-12-18 ENCOUNTER — Ambulatory Visit
Admission: RE | Admit: 2020-12-18 | Discharge: 2020-12-18 | Disposition: A | Discharge status: Home / Self Care | Payer: BC Managed Care – PPO | Source: Ambulatory Visit | Attending: Family Medicine | Admitting: Family Medicine

## 2020-12-18 DIAGNOSIS — N6002 Solitary cyst of left breast: Secondary | ICD-10-CM | POA: Insufficient documentation

## 2020-12-18 DIAGNOSIS — R928 Other abnormal and inconclusive findings on diagnostic imaging of breast: Secondary | ICD-10-CM | POA: Insufficient documentation

## 2021-03-09 ENCOUNTER — Encounter: Payer: Self-pay | Admitting: Gastroenterology

## 2021-03-12 ENCOUNTER — Ambulatory Visit
Admission: RE | Admit: 2021-03-12 | Discharge: 2021-03-12 | Disposition: A | Discharge status: Home / Self Care | Payer: BC Managed Care – PPO | Attending: Gastroenterology | Admitting: Gastroenterology

## 2021-03-12 ENCOUNTER — Ambulatory Visit: Payer: BC Managed Care – PPO | Admitting: Registered Nurse

## 2021-03-12 ENCOUNTER — Encounter
Admission: RE | Disposition: A | Discharge status: Home / Self Care | Payer: Self-pay | Source: Home / Self Care | Attending: Gastroenterology

## 2021-03-12 ENCOUNTER — Encounter: Payer: Self-pay | Admitting: Gastroenterology

## 2021-03-12 DIAGNOSIS — K640 First degree hemorrhoids: Secondary | ICD-10-CM | POA: Diagnosis not present

## 2021-03-12 DIAGNOSIS — Z1211 Encounter for screening for malignant neoplasm of colon: Secondary | ICD-10-CM | POA: Diagnosis present

## 2021-03-12 HISTORY — DX: Arthralgia of temporomandibular joint, unspecified side: M26.629

## 2021-03-12 HISTORY — PX: COLONOSCOPY WITH PROPOFOL: SHX5780

## 2021-03-12 HISTORY — DX: Allergic rhinitis, unspecified: J30.9

## 2021-03-12 HISTORY — DX: Vitamin D deficiency, unspecified: E55.9

## 2021-03-12 LAB — POCT PREGNANCY, URINE: Preg Test, Ur: NEGATIVE

## 2021-03-12 SURGERY — COLONOSCOPY WITH PROPOFOL
Anesthesia: General

## 2021-03-12 MED ORDER — SODIUM CHLORIDE 0.9 % IV SOLN: INTRAVENOUS | Status: DC

## 2021-03-12 MED ORDER — PROPOFOL 500 MG/50ML IV EMUL
INTRAVENOUS | Status: DC | PRN
  Administered 2021-03-12: 140 ug/kg/min via INTRAVENOUS

## 2021-03-12 MED ORDER — PROPOFOL 10 MG/ML IV BOLUS
INTRAVENOUS | Status: DC | PRN
  Administered 2021-03-12: 70 mg via INTRAVENOUS

## 2021-03-12 NOTE — Transfer of Care (Signed)
Immediate Anesthesia Transfer of Care Note  Patient: Heather Harrison  Procedure(s) Performed: COLONOSCOPY WITH PROPOFOL  Patient Location: PACU  Anesthesia Type:General  Level of Consciousness: awake, alert  and oriented  Airway & Oxygen Therapy: Patient Spontanous Breathing  Post-op Assessment: Report given to RN and Post -op Vital signs reviewed and stable  Post vital signs: Reviewed and stable  Last Vitals:  Vitals Value Taken Time  BP    Temp    Pulse    Resp    SpO2      Last Pain:  Vitals:   03/12/21 1001  TempSrc: Temporal  PainSc: 0-No pain         Complications: No notable events documented.

## 2021-03-12 NOTE — Interval H&P Note (Signed)
History and Physical Interval Note: Preprocedure H&P from 03/12/21  was reviewed and there was no interval change after seeing and examining the patient.  Written consent was obtained from the patient after discussion of risks, benefits, and alternatives. Patient has consented to proceed with Colonoscopy with possible intervention   03/12/2021 11:52 AM  Heather Harrison  has presented today for surgery, with the diagnosis of Colon Cancer Screening (Z12.11).  The various methods of treatment have been discussed with the patient and family. After consideration of risks, benefits and other options for treatment, the patient has consented to  Procedure(s): COLONOSCOPY WITH PROPOFOL (N/A) as a surgical intervention.  The patient's history has been reviewed, patient examined, no change in status, stable for surgery.  I have reviewed the patient's chart and labs.  Questions were answered to the patient's satisfaction.     Jaynie Collins

## 2021-03-12 NOTE — Anesthesia Postprocedure Evaluation (Signed)
Anesthesia Post Note  Patient: Racquel Minh Baley  Procedure(s) Performed: COLONOSCOPY WITH PROPOFOL  Patient location during evaluation: Phase II Anesthesia Type: General Level of consciousness: awake and alert, awake and oriented Pain management: pain level controlled Vital Signs Assessment: post-procedure vital signs reviewed and stable Respiratory status: spontaneous breathing, nonlabored ventilation and respiratory function stable Cardiovascular status: blood pressure returned to baseline and stable Postop Assessment: no apparent nausea or vomiting Anesthetic complications: no   No notable events documented.   Last Vitals:  Vitals:   03/12/21 1228 03/12/21 1238  BP: (!) 101/59 106/71  Pulse: 67 63  Resp: 17 19  Temp:    SpO2: 100% 100%    Last Pain:  Vitals:   03/12/21 1238  TempSrc:   PainSc: 0-No pain                 Manfred Arch

## 2021-03-12 NOTE — Op Note (Signed)
[facility] Gastroenterology Patient Name: Heather Harrison Procedure Date: 03/12/2021 11:38 AM MRN: [id number] Account #: [id number] Date of Birth: 08-01-1970 Admit Type: Outpatient Age: 50 Room: [facility] ENDO ROOM 2 Gender: Female Note Status: Finalized Instrument Name: Colonoscope [id number] Procedure:             Colonoscopy Indications:           Screening for colorectal malignant neoplasm Providers:             Annamaria Helling DO, DO Referring MD:          Rubbie Battiest. Iona Beard MD, MD (Referring MD) Medicines:             Monitored Anesthesia Care Complications:         No immediate complications. Estimated blood loss: None. Procedure:             Pre-Anesthesia Assessment:                        - Prior to the procedure, a History and Physical was                         performed, and patient medications and allergies were                         reviewed. The patient is competent. The risks and                         benefits of the procedure and the sedation options and                         risks were discussed with the patient. All questions                         were answered and informed consent was obtained.                         Patient identification and proposed procedure were                         verified by the physician, the nurse, the anesthetist                         and the technician in the endoscopy suite. Mental                         Status Examination: alert and oriented. Airway                         Examination: normal oropharyngeal airway and neck                         mobility. Respiratory Examination: clear to                         auscultation. CV Examination: RRR, no murmurs, no S3                         or S4. Prophylactic Antibiotics: The patient does not  require prophylactic antibiotics. Prior                         Anticoagulants: The patient has taken no previous                          anticoagulant or antiplatelet agents. ASA Grade                         Assessment: II - A patient with mild systemic disease.                         After reviewing the risks and benefits, the patient                         was deemed in satisfactory condition to undergo the                         procedure. The anesthesia plan was to use monitored                         anesthesia care (MAC). Immediately prior to                         administration of medications, the patient was                         re-assessed for adequacy to receive sedatives. The                         heart rate, respiratory rate, oxygen saturations,                         blood pressure, adequacy of pulmonary ventilation, and                         response to care were monitored throughout the                         procedure. The physical status of the patient was                         re-assessed after the procedure.                        After obtaining informed consent, the colonoscope was                         passed under direct vision. Throughout the procedure,                         the patient's blood pressure, pulse, and oxygen                         saturations were monitored continuously. The                         Colonoscope was introduced through the anus and  advanced to the the terminal ileum, with                         identification of the appendiceal orifice and IC                         valve. The colonoscopy was performed without                         difficulty. The patient tolerated the procedure well.                         The quality of the bowel preparation was evaluated                         using the BBPS [facility] [facility] Bowel Preparation Scale) with                         scores of: Right Colon = 3, Transverse Colon = 3 and                         Left Colon = 3 (entire mucosa seen well with no                         residual staining,  small fragments of stool or opaque                         liquid). The total BBPS score equals 9. The terminal                         ileum, ileocecal valve, appendiceal orifice, and                         rectum were photographed. Findings:      The perianal and digital rectal examinations were normal. Pertinent       negatives include normal sphincter tone.      The colon (entire examined portion) appeared normal.      Non-bleeding internal hemorrhoids were found during retroflexion. The       hemorrhoids were Grade I (internal hemorrhoids that do not prolapse). Impression:            - The entire examined colon is normal.                        - Non-bleeding internal hemorrhoids.                        - No specimens collected. Recommendation:        - Discharge patient to home.                        - Resume previous diet.                        - Continue present medications.                        - Repeat colonoscopy in 10 years for screening  purposes.                        - Return to referring physician as previously                         scheduled. Procedure Code(s):     --- Professional ---                        [phone], Colonoscopy, flexible; diagnostic, including                         collection of specimen(s) by brushing or washing, when                         performed (separate procedure) Diagnosis Code(s):     --- Professional ---                        Z12.11, Encounter for screening for malignant neoplasm                         of colon                        K64.0, First degree hemorrhoids CPT copyright 2019 American Medical Association. All rights reserved. The codes documented in this report are preliminary and upon coder review may  be revised to meet current compliance requirements. Attending Participation:      I personally performed the entire procedure. Volney American, DO Annamaria Helling DO, DO 03/12/2021 12:22:55  PM This report has been signed electronically. Number of Addenda: 0 Note Initiated On: 03/12/2021 11:38 AM Scope Withdrawal Time: 0 hours 9 minutes 11 seconds  Total Procedure Duration: 0 hours 16 minutes 9 seconds  Estimated Blood Loss:  Estimated blood loss: none.      [facility]

## 2021-03-12 NOTE — Anesthesia Preprocedure Evaluation (Signed)
Anesthesia Evaluation  Patient identified by MRN, date of birth, ID band Patient awake    Reviewed: Allergy & Precautions, NPO status , Patient's Chart, lab work & pertinent test results  Airway Mallampati: II  TM Distance: >3 FB Neck ROM: Full    Dental no notable dental hx.    Pulmonary neg pulmonary ROS,    Pulmonary exam normal        Cardiovascular negative cardio ROS Normal cardiovascular exam     Neuro/Psych negative neurological ROS  negative psych ROS   GI/Hepatic negative GI ROS, Neg liver ROS, Bowel prep,  Endo/Other  negative endocrine ROS  Renal/GU negative Renal ROS  negative genitourinary   Musculoskeletal negative musculoskeletal ROS (+)   Abdominal   Peds negative pediatric ROS (+)  Hematology negative hematology ROS (+)   Anesthesia Other Findings  Vitamin D deficiency   Bilateral temporomandibular joint pain   Allergic rhinitis   Skin lesion of breast   H/O abnormal mammogram     Reproductive/Obstetrics negative OB ROS                             Anesthesia Physical Anesthesia Plan  ASA: 2  Anesthesia Plan: General   Post-op Pain Management:    Induction: Intravenous  PONV Risk Score and Plan: 2 and Propofol infusion and TIVA  Airway Management Planned: Natural Airway and Nasal Cannula  Additional Equipment:   Intra-op Plan:   Post-operative Plan:   Informed Consent: I have reviewed the patients History and Physical, chart, labs and discussed the procedure including the risks, benefits and alternatives for the proposed anesthesia with the patient or authorized representative who has indicated his/her understanding and acceptance.       Plan Discussed with: CRNA, Anesthesiologist and Surgeon  Anesthesia Plan Comments:         Anesthesia Quick Evaluation

## 2021-03-12 NOTE — H&P (Signed)
Pre-Procedure H&P   Patient ID: Heather Harrison is a 50 y.o. female.  Gastroenterology Provider: Jaynie Collins, DO  Referring Provider: Dr. Greggory Stallion PCP: Rayetta Humphrey, MD  Date: 03/12/2021  HPI Heather Harrison is a 50 y.o. female who presents today for Colonoscopy for initial screening colonoscopy. Assessed via medical teleinterpreter Normal bm- w/o blood, melena, diarrhea, constipation.  No other acute GI complaints.   Past Medical History:  Diagnosis Date   Allergic rhinitis    Temporomandibular joint pain    Vitamin D deficiency     Past Surgical History:  Procedure Laterality Date   BREAST BIOPSY Left    negative   c-section      Family History No h/o GI disease or malignancy  Review of Systems  Constitutional:  Negative for activity change, appetite change, chills, diaphoresis, fatigue, fever and unexpected weight change.  HENT:  Negative for trouble swallowing and voice change.   Respiratory:  Negative for shortness of breath and wheezing.   Cardiovascular:  Negative for chest pain, palpitations and leg swelling.  Gastrointestinal:  Negative for abdominal distention, abdominal pain, anal bleeding, blood in stool, constipation, diarrhea, nausea, rectal pain and vomiting.  Musculoskeletal:  Negative for arthralgias and myalgias.  Skin:  Negative for color change and pallor.  Neurological:  Negative for dizziness, syncope and weakness.  Psychiatric/Behavioral:  Negative for confusion.   All other systems reviewed and are negative.   Medications No current facility-administered medications on file prior to encounter.   Current Outpatient Medications on File Prior to Encounter  Medication Sig Dispense Refill   chlorhexidine (PERIDEX) 0.12 % solution 15 mL swish and spit bid 480 mL 0   cholecalciferol (VITAMIN D3) 25 MCG (1000 UT) tablet Take 1,000 Units by mouth daily.     nitrofurantoin, macrocrystal-monohydrate, (MACROBID) 100 MG capsule Take 1  capsule (100 mg total) by mouth 2 (two) times daily. 10 capsule 0   omeprazole (PRILOSEC OTC) 20 MG tablet Take by mouth.     phenazopyridine (PYRIDIUM) 200 MG tablet Take 1 tablet (200 mg total) by mouth 3 (three) times daily. 6 tablet 0    Pertinent medications related to GI and procedure were reviewed by me with the patient prior to the procedure   Current Facility-Administered Medications:    0.9 %  sodium chloride infusion, , Intravenous, Continuous, Jaynie Collins, DO, Last Rate: 20 mL/hr at 03/12/21 1030, New Bag at 03/12/21 1030  sodium chloride 20 mL/hr at 03/12/21 1030       No Known Allergies Allergies were reviewed by me prior to the procedure  Objective    Vitals:   03/12/21 1001  BP: 134/86  Pulse: (!) 110  Temp: (!) 97.1 F (36.2 C)  TempSrc: Temporal  SpO2: 100%  Weight: 52.2 kg  Height: 5\' 4"  (1.626 m)     Physical Exam Vitals and nursing note reviewed.  Constitutional:      General: She is not in acute distress.    Appearance: Normal appearance. She is not ill-appearing, toxic-appearing or diaphoretic.  HENT:     Head: Normocephalic and atraumatic.     Nose: Nose normal.     Mouth/Throat:     Mouth: Mucous membranes are moist.     Pharynx: Oropharynx is clear.  Eyes:     General: No scleral icterus.    Extraocular Movements: Extraocular movements intact.  Cardiovascular:     Rate and Rhythm: Normal rate and regular rhythm.  Heart sounds: Normal heart sounds. No murmur heard.   No friction rub. No gallop.  Pulmonary:     Effort: Pulmonary effort is normal. No respiratory distress.     Breath sounds: Normal breath sounds. No wheezing, rhonchi or rales.  Abdominal:     General: Abdomen is flat. Bowel sounds are normal. There is no distension.     Palpations: Abdomen is soft.     Tenderness: There is no abdominal tenderness. There is no guarding or rebound.  Musculoskeletal:     Cervical back: Neck supple.     Right lower leg: No  edema.     Left lower leg: No edema.  Skin:    General: Skin is warm and dry.     Coloration: Skin is not jaundiced or pale.  Neurological:     General: No focal deficit present.     Mental Status: She is alert and oriented to person, place, and time. Mental status is at baseline.  Psychiatric:        Mood and Affect: Mood normal.        Behavior: Behavior normal.        Thought Content: Thought content normal.        Judgment: Judgment normal.     Assessment:  Heather Harrison is a 50 y.o. female  who presents today for Colonoscopy for initial screening colonosocpy.  Plan:  Colonoscopy with possible intervention today  Colonoscopy with possible biopsy, control of bleeding, polypectomy, and interventions as necessary has been discussed with the patient/patient representative. Informed consent was obtained from the patient/patient representative after explaining the indication, nature, and risks of the procedure including but not limited to death, bleeding, perforation, missed neoplasm/lesions, cardiorespiratory compromise, and reaction to medications. Opportunity for questions was given and appropriate answers were provided. Patient/patient representative has verbalized understanding is amenable to undergoing the procedure.   Jaynie Collins, DO  [facility]  Portions of the record may have been created with voice recognition software. Occasional wrong-word or 'sound-a-like' substitutions may have occurred due to the inherent limitations of voice recognition software.  Read the chart carefully and recognize, using context, where substitutions may have occurred.

## 2021-03-13 ENCOUNTER — Encounter: Payer: Self-pay | Admitting: Gastroenterology

## 2021-10-10 ENCOUNTER — Other Ambulatory Visit: Payer: Self-pay | Admitting: Family Medicine

## 2021-10-10 DIAGNOSIS — Z1231 Encounter for screening mammogram for malignant neoplasm of breast: Secondary | ICD-10-CM

## 2021-12-19 ENCOUNTER — Ambulatory Visit
Admission: RE | Admit: 2021-12-19 | Discharge: 2021-12-19 | Disposition: A | Discharge status: Home / Self Care | Payer: BC Managed Care – PPO | Source: Ambulatory Visit | Attending: Family Medicine | Admitting: Family Medicine

## 2021-12-19 DIAGNOSIS — Z1231 Encounter for screening mammogram for malignant neoplasm of breast: Secondary | ICD-10-CM | POA: Insufficient documentation

## 2021-12-21 ENCOUNTER — Ambulatory Visit
Admission: EM | Admit: 2021-12-21 | Discharge: 2021-12-21 | Disposition: A | Discharge status: Home / Self Care | Payer: BC Managed Care – PPO | Attending: Emergency Medicine | Admitting: Emergency Medicine

## 2021-12-21 ENCOUNTER — Encounter: Payer: Self-pay | Admitting: Emergency Medicine

## 2021-12-21 DIAGNOSIS — R21 Rash and other nonspecific skin eruption: Secondary | ICD-10-CM

## 2021-12-21 MED ORDER — PREDNISONE 10 MG (21) PO TBPK: ORAL_TABLET | Freq: Every day | ORAL | 0 refills | Status: AC

## 2021-12-21 NOTE — ED Triage Notes (Signed)
Patient c/o itchy burning red rash on her left shoulder and arm for the past week.  Patient denies fevers.

## 2021-12-21 NOTE — ED Provider Notes (Signed)
[facility]    CSN: [id number] Arrival date & time: 12/21/21  1525      History   Chief Complaint Chief Complaint  Patient presents with   Rash    HPI Heather Harrison is a 51 y.o. female.   Patient presents with erythematous and pruritic rash present to the left upper arm for 7 days.  Initially began as a small red dot but increased in size and spread down the arm.  Has attempted use of cortisone cream and Benadryl cream which has been somewhat helpful but rash has persisted.  Denies changes in soaps, lotions detergents, exposure to plant or leafy areas, dietary changes or wooded areas.  Past Medical History:  Diagnosis Date   Allergic rhinitis    Temporomandibular joint pain    Vitamin D deficiency     There are no problems to display for this patient.   Past Surgical History:  Procedure Laterality Date   BREAST BIOPSY Left    negative   c-section     COLONOSCOPY WITH PROPOFOL N/A 03/12/2021   Procedure: COLONOSCOPY WITH PROPOFOL;  Surgeon: Annamaria Helling, DO;  Location: [facility] ENDOSCOPY;  Service: Gastroenterology;  Laterality: N/A;    OB History   No obstetric history on file.      Home Medications    Prior to Admission medications   Medication Sig Start Date End Date Taking? Authorizing Provider  cholecalciferol (VITAMIN D3) 25 MCG (1000 UT) tablet Take 1,000 Units by mouth daily.   Yes [provider]  omeprazole (PRILOSEC OTC) 20 MG tablet Take by mouth.   Yes [provider]  chlorhexidine (PERIDEX) 0.12 % solution 15 mL swish and spit bid 12/15/19   Melynda Ripple, MD  nitrofurantoin, macrocrystal-monohydrate, (MACROBID) 100 MG capsule Take 1 capsule (100 mg total) by mouth 2 (two) times daily. 05/16/20   Margarette Canada, NP  phenazopyridine (PYRIDIUM) 200 MG tablet Take 1 tablet (200 mg total) by mouth 3 (three) times daily. 05/16/20   Margarette Canada, NP    Family History Family History  Problem Relation Age of Onset    Breast cancer Mother 32    Social History Social History   Tobacco Use   Smoking status: Never   Smokeless tobacco: Never  Vaping Use   Vaping Use: Never used  Substance Use Topics   Alcohol use: No   Drug use: No     Allergies   Patient has no known allergies.   Review of Systems Review of Systems  Constitutional: Negative.   Respiratory: Negative.    Cardiovascular: Negative.   Skin:  Positive for rash. Negative for color change, pallor and wound.  Neurological: Negative.      Physical Exam Triage Vital Signs ED Triage Vitals  Enc Vitals Group     BP 12/21/21 1539 139/84     Pulse Rate 12/21/21 1539 72     Resp 12/21/21 1539 14     Temp 12/21/21 1539 98.9 F (37.2 C)     Temp Source 12/21/21 1539 Oral     SpO2 12/21/21 1539 98 %     Weight 12/21/21 1537 121 lb (54.9 kg)     Height 12/21/21 1537 5\' 4"  (1.626 m)     Head Circumference --      Peak Flow --      Pain Score 12/21/21 1536 2     Pain Loc --      Pain Edu? --      Excl. in  GC? --    No data found.  Updated Vital Signs BP 139/84 (BP Location: Right Arm)   Pulse 72   Temp 98.9 F (37.2 C) (Oral)   Resp 14   Ht 5\' 4"  (1.626 m)   Wt 121 lb (54.9 kg)   LMP 12/12/2021   SpO2 98%   BMI 20.77 kg/m   Visual Acuity Right Eye Distance:   Left Eye Distance:   Bilateral Distance:    Right Eye Near:   Left Eye Near:    Bilateral Near:     Physical Exam Constitutional:      Appearance: Normal appearance.  HENT:     Head: Normocephalic.  Eyes:     Extraocular Movements: Extraocular movements intact.  Pulmonary:     Effort: Pulmonary effort is normal.  Skin:    Comments: Erythematous papular rash present to the anterior of the left shoulder and into the left axilla  Neurological:     Mental Status: She is alert and oriented to person, place, and time. Mental status is at baseline.  Psychiatric:        Mood and Affect: Mood normal.        Behavior: Behavior normal.      UC  Treatments / Results  Labs (all labs ordered are listed, but only abnormal results are displayed) Labs Reviewed - No data to display  EKG   Radiology No results found.  Procedures Procedures (including critical care time)  Medications Ordered in UC Medications - No data to display  Initial Impression / Assessment and Plan / UC Course  I have reviewed the triage vital signs and the nursing notes.  Pertinent labs & imaging results that were available during my care of the patient were reviewed by me and considered in my medical decision making (see chart for details).  Rash  Unknown etiology, appears inflammatory in process, no signs of infection, discussed with patient, prednisone prescribed and may continue use of topical products if helpful, recommended avoidance of long exposure to heat to prevent further irritation, may follow-up with urgent care as needed if symptoms persist or worsen Final Clinical Impressions(s) / UC Diagnoses   Final diagnoses:  None   Discharge Instructions   None    ED Prescriptions   None    PDMP not reviewed this encounter.   Hans Eden, NP 12/21/21 1601

## 2021-12-21 NOTE — Discharge Instructions (Signed)
The rash to your arm appears to be caused by irritation to something that your skin came in contact with, at this time there are no signs of infection  Begin prednisone every morning with food as directed on the packaging, this medicine will help to calm the inflammation and slowly clear rash  You may continue use of topical hydrocortisone cream as well as topical Benadryl cream to help manage the itching  Avoid long exposure to heat such as hot steamy showers or sunlight as this may cause further irritation  You may follow-up with urgent care for any concerns regarding healing  The area to your breast is possibly a mole that appears differently than the ones on your arm as it is covering the nipple, at this it is recommended that you get a biopsy I have given you information to dermatology, you can also ask your primary doctor for referral

## 2022-12-15 IMAGING — MG DIGITAL DIAGNOSTIC BILAT W/ TOMO W/ CAD
8 series · 8 of 24 positions shown · non-contrast
Comparison: Previous exam(s).

CLINICAL DATA: Follow-up of probably benign left breast masses and
asymmetry, last imaged in September 2017.

EXAM:
DIGITAL DIAGNOSTIC BILATERAL MAMMOGRAM WITH TOMOSYNTHESIS AND CAD;
ULTRASOUND LEFT BREAST LIMITED
TECHNIQUE: Bilateral digital diagnostic mammography and breast tomosynthesis
was performed. The images were evaluated with computer-aided
detection.; Targeted ultrasound examination of the left breast was
performed.

[L MLO synth-2D]
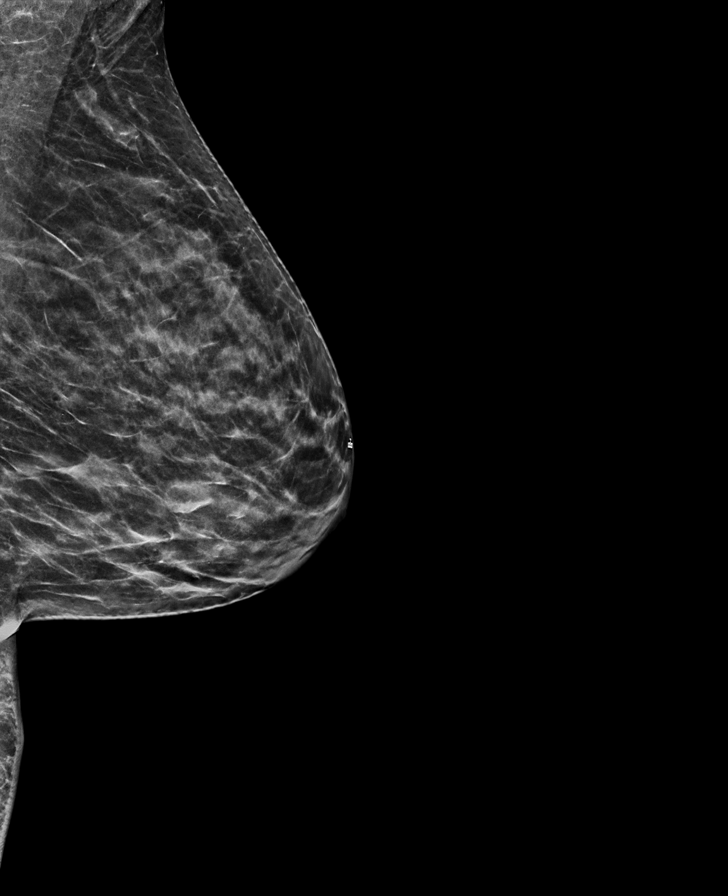

[R CC synth-2D]
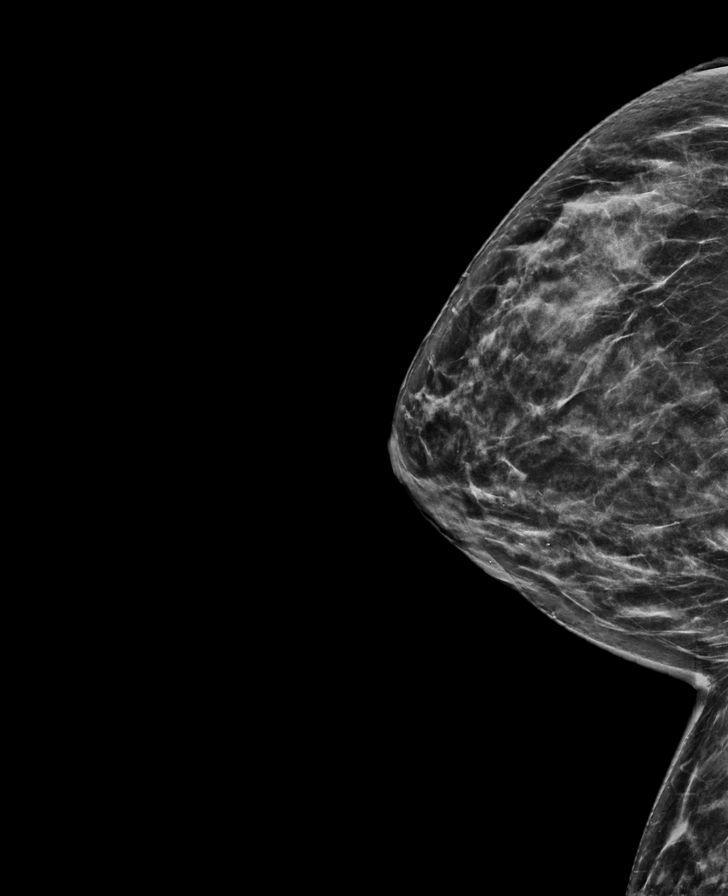

[R MLO synth-2D]
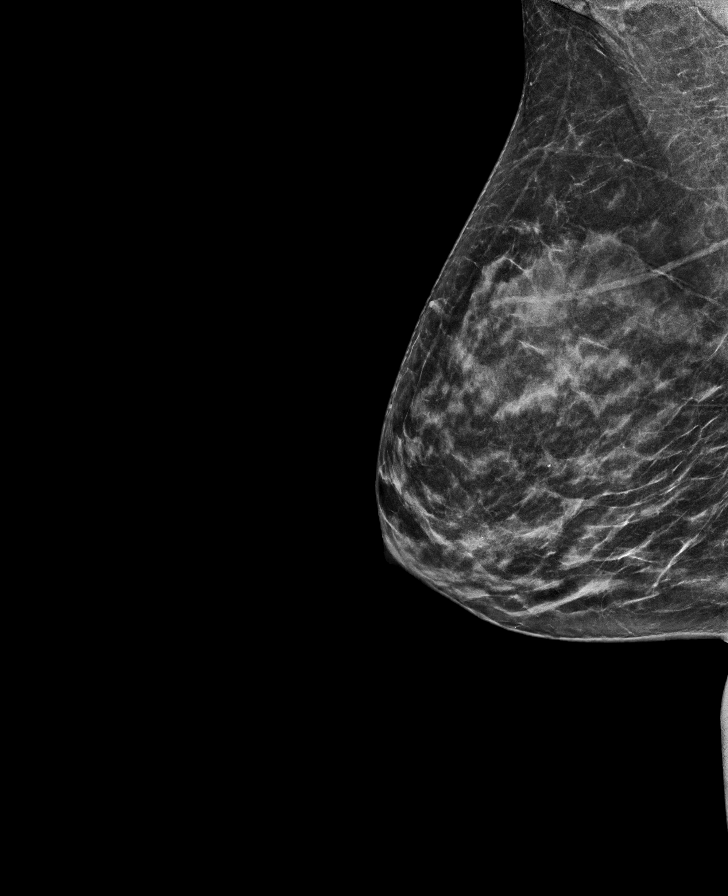

[L CC synth-2D]
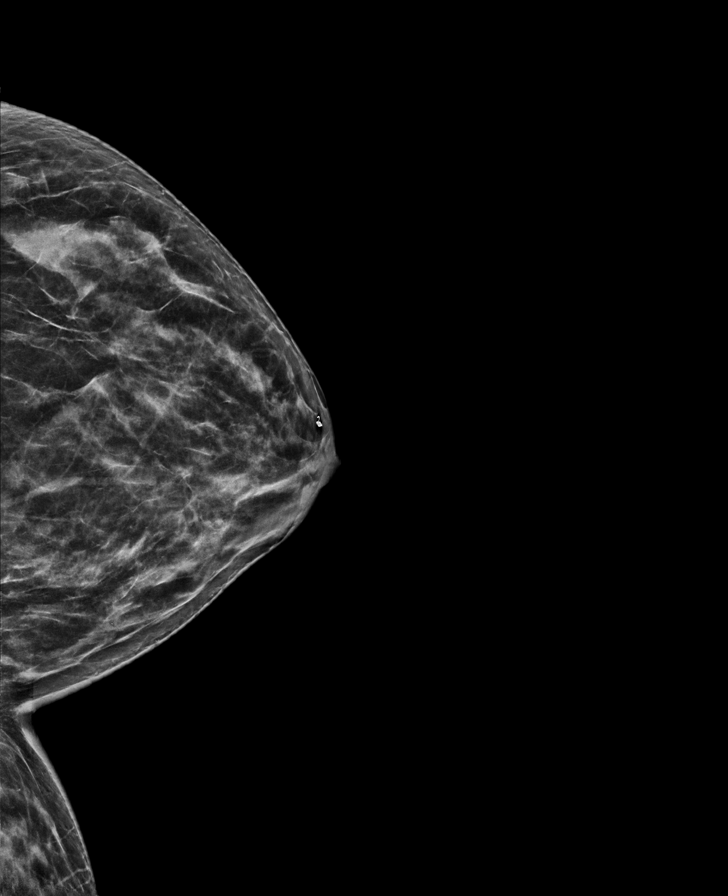

[L CC tomo · tomo slice 30/59.0]
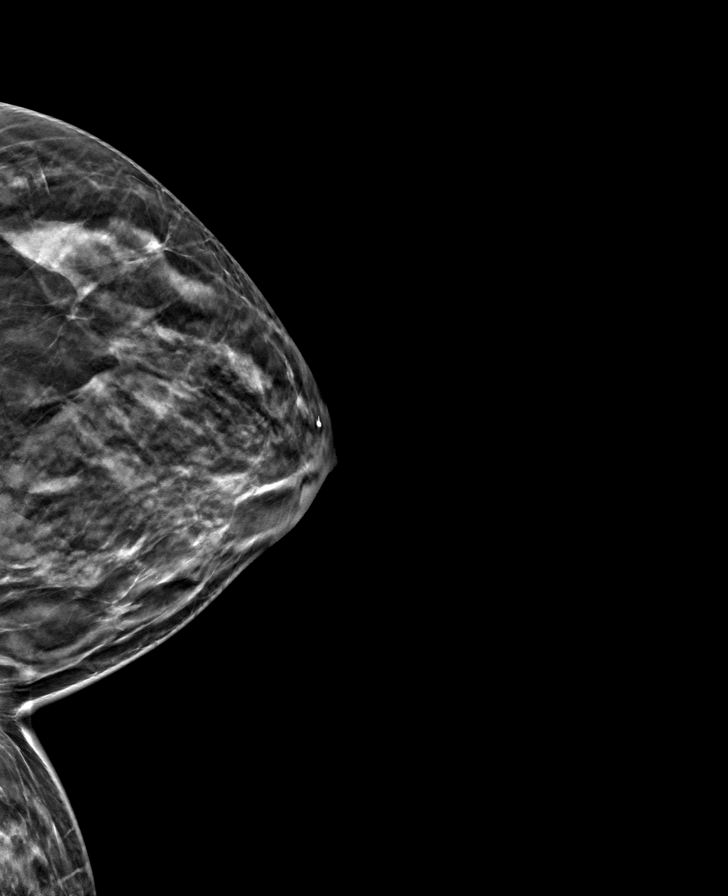

[R MLO tomo · tomo slice 31/60.0]
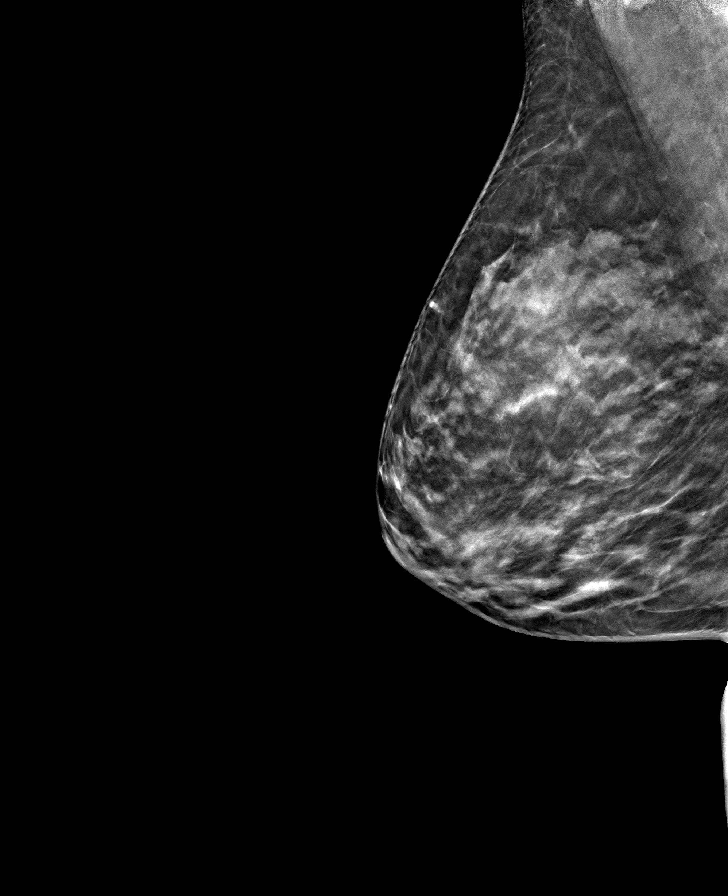

[R CC tomo · tomo slice 32/63.0]
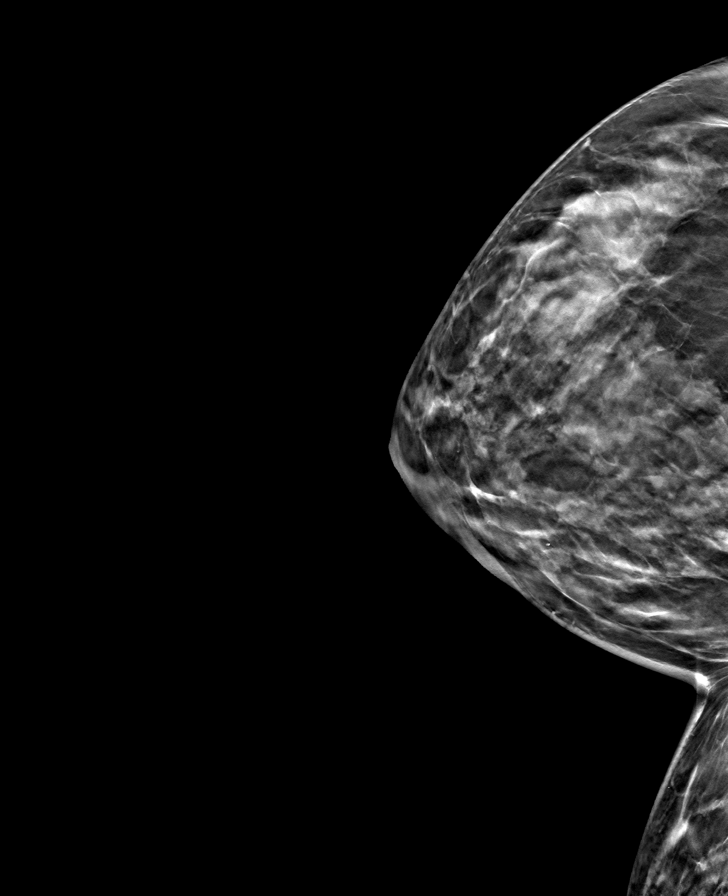

[L MLO tomo · tomo slice 29/57.0]
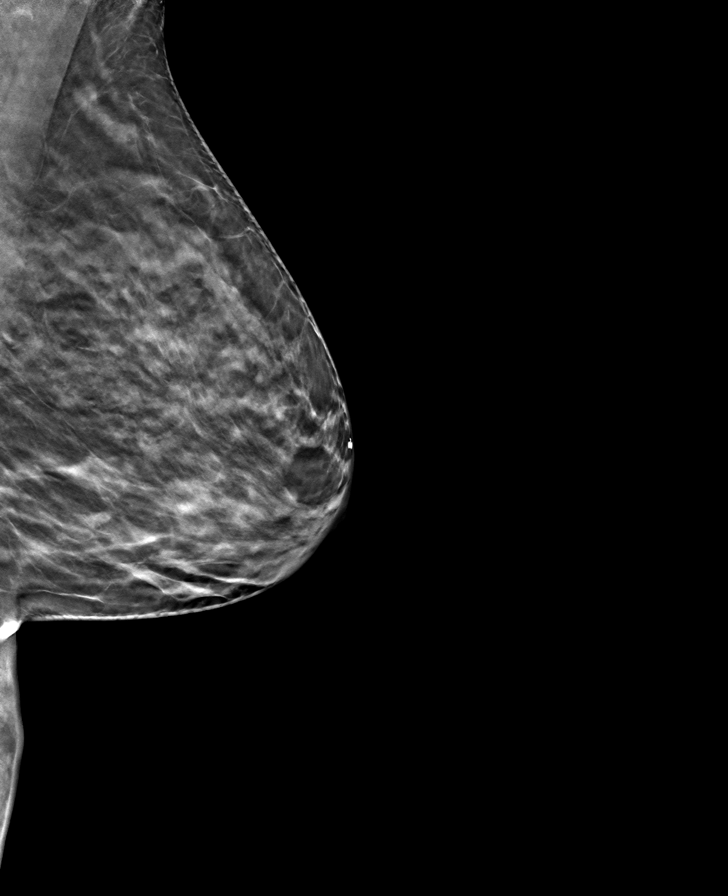

[8 of 24 positions shown; findings below may reference images not displayed]

ACR Breast Density Category c: The breast tissue is heterogeneously
dense, which may obscure small masses.
FINDINGS: Mammographically, there are no suspicious masses, areas of
architectural distortion or microcalcifications in either breast.
The previously seen circumscribed masses in the inner and outer left
breast have resolved.

Targeted left breast ultrasound is performed demonstrating no
suspicious masses or shadowing lesions.
IMPRESSION: No mammographic or sonographic evidence of left breast malignancy.

RECOMMENDATION:
Screening mammogram in one year.(Code:OU-F-EMG)

I have discussed the findings and recommendations with the patient.
If applicable, a reminder letter will be sent to the patient
regarding the next appointment.

BI-RADS CATEGORY  1: Negative.
